# Patient Record
Sex: Male | Born: 1945 | Race: White | Hispanic: Yes | Marital: Married | Smoking: Never smoker
Health system: Southern US, Community
[De-identification: ages and names within clinical notes are randomized; demographics above are authoritative.]

## PROBLEM LIST (undated history)

## (undated) DIAGNOSIS — N200 Calculus of kidney: Secondary | ICD-10-CM

## (undated) DIAGNOSIS — K219 Gastro-esophageal reflux disease without esophagitis: Secondary | ICD-10-CM

## (undated) DIAGNOSIS — M1990 Unspecified osteoarthritis, unspecified site: Secondary | ICD-10-CM

## (undated) DIAGNOSIS — I499 Cardiac arrhythmia, unspecified: Secondary | ICD-10-CM

## (undated) DIAGNOSIS — N419 Inflammatory disease of prostate, unspecified: Secondary | ICD-10-CM

## (undated) DIAGNOSIS — I10 Essential (primary) hypertension: Secondary | ICD-10-CM

## (undated) DIAGNOSIS — G4730 Sleep apnea, unspecified: Secondary | ICD-10-CM

## (undated) HISTORY — PX: OTHER SURGICAL HISTORY: SHX169

## (undated) HISTORY — PX: TONSILLECTOMY: SUR1361

---

## 2014-11-05 ENCOUNTER — Other Ambulatory Visit (HOSPITAL_COMMUNITY): Payer: Self-pay

## 2014-11-07 ENCOUNTER — Encounter (HOSPITAL_COMMUNITY): Payer: Self-pay | Admitting: *Deleted

## 2014-11-08 ENCOUNTER — Encounter (HOSPITAL_COMMUNITY): Payer: Self-pay | Admitting: *Deleted

## 2014-11-08 NOTE — H&P (Signed)
TOTAL HIP ADMISSION H&P  Patient is admitted for bilateral total hip arthroplasties.  Subjective:  Chief Complaint:    Bilateral hip primary OA / pain  HPI: Lawrence Hunt, 69 y.o. male, has a history of pain and functional disability in bilateral hips due to arthritis and patient has failed non-surgical conservative treatments for greater than 12 weeks to include NSAID's and/or analgesics and activity modification.  Onset of symptoms was gradual starting years ago with gradually worsening course since that time.The patient noted no past surgery on the bilateral hips.  Patient currently rates pain in the bilateral hips at 9 out of 10 with activity. Patient has worsening of pain with activity and weight bearing, trendelenberg gait, pain that interfers with activities of daily living and pain with passive range of motion. Patient has evidence of periarticular osteophytes and joint space narrowing by imaging studies. This condition presents safety issues increasing the risk of falls.  There is no current active infection.  Risks, benefits and expectations were discussed with the patient.  Risks including but not limited to the risk of anesthesia, blood clots, nerve damage, blood vessel damage, failure of the prosthesis, infection and up to and including death.  Patient understand the risks, benefits and expectations and wishes to proceed with surgery.   PCP: No primary care provider on file.  D/C Plans:      Home with HHPT  Post-op Meds:       No Rx given  Tranexamic Acid:      To be given - IV   Decadron:      Is to be given  FYI: ASA  Analgesic medication ?  CPAP   Past Medical History  Diagnosis Date  . Hypertension   . Dysrhythmia     atrial fib   . Sleep apnea     cpap- does not know settings   . Kidney stones     hx of   . Prostatitis   . GERD (gastroesophageal reflux disease)   . Arthritis     Past Surgical History  Procedure Laterality Date  . Ablaton x 2 for atrial fib      . Tonsillectomy    . Left finger fracture surgery       No prescriptions prior to admission   Allergies  Allergen Reactions  . Codeine Itching  . Latex Dermatitis  . Other Dermatitis    Nitro gloves   . Septra [Sulfamethoxazole-Trimethoprim]     Burning sensation in the face.    Social History  Substance Use Topics  . Smoking status: Never Smoker   . Smokeless tobacco: Never Used  . Alcohol Use: Not on file     Comment: rare        Review of Systems  Constitutional: Negative.   HENT: Negative.   Eyes: Negative.   Respiratory: Negative.   Cardiovascular: Negative.   Gastrointestinal: Positive for heartburn.  Genitourinary: Negative.   Musculoskeletal: Positive for joint pain.  Skin: Negative.   Neurological: Negative.   Endo/Heme/Allergies: Negative.   Psychiatric/Behavioral: Negative.     Objective:  Physical Exam  Constitutional: He is oriented to person, place, and time. He appears well-developed.  HENT:  Head: Normocephalic.  Eyes: Pupils are equal, round, and reactive to light.  Neck: Neck supple. No JVD present. No tracheal deviation present. No thyromegaly present.  Cardiovascular: Normal heart sounds and intact distal pulses.   Respiratory: Effort normal and breath sounds normal.  GI: Soft. There is no tenderness. There is  no guarding.  Musculoskeletal:       Right hip: He exhibits decreased range of motion, decreased strength, tenderness and bony tenderness. He exhibits no swelling, no deformity and no laceration.       Left hip: He exhibits decreased range of motion, decreased strength, tenderness and bony tenderness. He exhibits no swelling, no deformity and no laceration.  Lymphadenopathy:    He has no cervical adenopathy.  Neurological: He is alert and oriented to person, place, and time.  Skin: Skin is warm and dry.  Psychiatric: He has a normal mood and affect.     Imaging Review Plain radiographs demonstrate severe degenerative joint  disease of bilateral hips. The bone quality appears to be good for age and reported activity level.  Assessment/Plan:  End stage arthritis, bilateral hips  The patient history, physical examination, clinical judgement of the provider and imaging studies are consistent with end stage degenerative joint disease of bilateral hips and total hip arthroplasties are deemed medically necessary. The treatment options including medical management, injection therapy, arthroscopy and arthroplasty were discussed at length. The risks and benefits of total hip arthroplasty were presented and reviewed. The risks due to aseptic loosening, infection, stiffness, dislocation/subluxation,  thromboembolic complications and other imponderables were discussed.  The patient acknowledged the explanation, agreed to proceed with the plan and consent was signed. Patient is being admitted for inpatient treatment for surgery, pain control, PT, OT, prophylactic antibiotics, VTE prophylaxis, progressive ambulation and ADL's and discharge planning.The patient is planning to be discharged home with home health services.     Lawrence Auerbach [name]   PA-C  11/08/2014, 9:13 AM

## 2014-11-12 ENCOUNTER — Encounter (HOSPITAL_COMMUNITY): Payer: Self-pay | Admitting: *Deleted

## 2014-11-12 ENCOUNTER — Inpatient Hospital Stay (HOSPITAL_COMMUNITY): Payer: Managed Care, Other (non HMO) | Admitting: Anesthesiology

## 2014-11-12 ENCOUNTER — Encounter (HOSPITAL_COMMUNITY)
Admission: RE | Disposition: A | Discharge status: Home Health | Payer: Self-pay | Source: Ambulatory Visit | Attending: Orthopedic Surgery

## 2014-11-12 ENCOUNTER — Inpatient Hospital Stay (HOSPITAL_COMMUNITY): Payer: Managed Care, Other (non HMO)

## 2014-11-12 ENCOUNTER — Inpatient Hospital Stay (HOSPITAL_COMMUNITY)
Admission: RE | Admit: 2014-11-12 | Discharge: 2014-11-14 | DRG: 470 | Disposition: A | Discharge status: Home Health | Payer: Managed Care, Other (non HMO) | Source: Ambulatory Visit | Attending: Orthopedic Surgery | Admitting: Orthopedic Surgery

## 2014-11-12 DIAGNOSIS — Z96649 Presence of unspecified artificial hip joint: Secondary | ICD-10-CM

## 2014-11-12 DIAGNOSIS — I10 Essential (primary) hypertension: Secondary | ICD-10-CM | POA: Diagnosis present

## 2014-11-12 DIAGNOSIS — M160 Bilateral primary osteoarthritis of hip: Principal | ICD-10-CM | POA: Diagnosis present

## 2014-11-12 DIAGNOSIS — Z6828 Body mass index (BMI) 28.0-28.9, adult: Secondary | ICD-10-CM

## 2014-11-12 DIAGNOSIS — Z96643 Presence of artificial hip joint, bilateral: Secondary | ICD-10-CM

## 2014-11-12 DIAGNOSIS — E663 Overweight: Secondary | ICD-10-CM | POA: Diagnosis present

## 2014-11-12 HISTORY — DX: Inflammatory disease of prostate, unspecified: N41.9

## 2014-11-12 HISTORY — DX: Gastro-esophageal reflux disease without esophagitis: K21.9

## 2014-11-12 HISTORY — DX: Calculus of kidney: N20.0

## 2014-11-12 HISTORY — DX: Sleep apnea, unspecified: G47.30

## 2014-11-12 HISTORY — PX: BILATERAL ANTERIOR TOTAL HIP ARTHROPLASTY: SHX5567

## 2014-11-12 HISTORY — DX: Cardiac arrhythmia, unspecified: I49.9

## 2014-11-12 HISTORY — DX: Unspecified osteoarthritis, unspecified site: M19.90

## 2014-11-12 HISTORY — DX: Essential (primary) hypertension: I10

## 2014-11-12 LAB — TYPE AND SCREEN
ABO/RH(D): O POS
ANTIBODY SCREEN: NEGATIVE

## 2014-11-12 LAB — ABO/RH: ABO/RH(D): O POS

## 2014-11-12 SURGERY — ARTHROPLASTY, HIP, BILATERAL, TOTAL, ANTERIOR APPROACH
Anesthesia: Spinal | Site: Hip | Laterality: Bilateral

## 2014-11-12 MED ORDER — LACTATED RINGERS IV SOLN
INTRAVENOUS | Status: DC
  Administered 2014-11-12: 1000 mL via INTRAVENOUS
  Administered 2014-11-12 (×2): via INTRAVENOUS

## 2014-11-12 MED ORDER — EPHEDRINE SULFATE 50 MG/ML IJ SOLN
INTRAMUSCULAR | Status: AC
  Filled 2014-11-12: qty 1

## 2014-11-12 MED ORDER — PROPOFOL 10 MG/ML IV BOLUS
INTRAVENOUS | Status: AC
  Filled 2014-11-12: qty 20

## 2014-11-12 MED ORDER — 0.9 % SODIUM CHLORIDE (POUR BTL) OPTIME
TOPICAL | Status: DC | PRN
  Administered 2014-11-12: 1000 mL

## 2014-11-12 MED ORDER — MIDAZOLAM HCL 2 MG/2ML IJ SOLN
INTRAMUSCULAR | Status: AC
  Filled 2014-11-12: qty 4

## 2014-11-12 MED ORDER — CEFAZOLIN SODIUM-DEXTROSE 2-3 GM-% IV SOLR
2.0000 g | INTRAVENOUS | Status: AC
  Administered 2014-11-12: 2 g via INTRAVENOUS

## 2014-11-12 MED ORDER — TRANEXAMIC ACID 1000 MG/10ML IV SOLN
1000.0000 mg | INTRAVENOUS | Status: AC
  Administered 2014-11-12: 1000 mg via INTRAVENOUS
  Filled 2014-11-12: qty 10

## 2014-11-12 MED ORDER — FENTANYL CITRATE (PF) 100 MCG/2ML IJ SOLN
INTRAMUSCULAR | Status: AC
  Filled 2014-11-12: qty 4

## 2014-11-12 MED ORDER — HYDROCODONE-ACETAMINOPHEN 7.5-325 MG PO TABS
1.0000 | ORAL_TABLET | ORAL | Status: DC
  Administered 2014-11-12: 1 via ORAL
  Administered 2014-11-13 (×4): 2 via ORAL
  Filled 2014-11-12 (×5): qty 2

## 2014-11-12 MED ORDER — STERILE WATER FOR IRRIGATION IR SOLN
Status: DC | PRN
  Administered 2014-11-12: 1000 mL

## 2014-11-12 MED ORDER — DEXAMETHASONE SODIUM PHOSPHATE 10 MG/ML IJ SOLN
10.0000 mg | Freq: Once | INTRAMUSCULAR | Status: AC
  Administered 2014-11-13: 10 mg via INTRAVENOUS
  Filled 2014-11-12: qty 1

## 2014-11-12 MED ORDER — MENTHOL 3 MG MT LOZG: 1.0000 | LOZENGE | OROMUCOSAL | Status: DC | PRN

## 2014-11-12 MED ORDER — FAMOTIDINE 20 MG PO TABS
20.0000 mg | ORAL_TABLET | Freq: Every day | ORAL | Status: DC | PRN
  Filled 2014-11-12: qty 1

## 2014-11-12 MED ORDER — FERROUS SULFATE 325 (65 FE) MG PO TABS
325.0000 mg | ORAL_TABLET | Freq: Three times a day (TID) | ORAL | Status: DC
Start: 2014-11-12 — End: 2014-11-14
  Administered 2014-11-12 – 2014-11-14 (×6): 325 mg via ORAL
  Filled 2014-11-12 (×8): qty 1

## 2014-11-12 MED ORDER — HYDROMORPHONE HCL 1 MG/ML IJ SOLN
0.5000 mg | INTRAMUSCULAR | Status: DC | PRN
  Administered 2014-11-12 – 2014-11-13 (×3): 1 mg via INTRAVENOUS
  Filled 2014-11-12: qty 1
  Filled 2014-11-12: qty 2
  Filled 2014-11-12 (×2): qty 1

## 2014-11-12 MED ORDER — BUPIVACAINE HCL (PF) 0.5 % IJ SOLN
INTRAMUSCULAR | Status: DC | PRN
  Administered 2014-11-12: 3 mL

## 2014-11-12 MED ORDER — APIXABAN 5 MG PO TABS
5.0000 mg | ORAL_TABLET | Freq: Two times a day (BID) | ORAL | Status: DC
  Administered 2014-11-13 – 2014-11-14 (×3): 5 mg via ORAL
  Filled 2014-11-12 (×5): qty 1

## 2014-11-12 MED ORDER — PROPOFOL 500 MG/50ML IV EMUL
INTRAVENOUS | Status: DC | PRN
  Administered 2014-11-12: 30 mg via INTRAVENOUS

## 2014-11-12 MED ORDER — ALUM & MAG HYDROXIDE-SIMETH 200-200-20 MG/5ML PO SUSP: 30.0000 mL | ORAL | Status: DC | PRN

## 2014-11-12 MED ORDER — ONDANSETRON HCL 4 MG/2ML IJ SOLN: 4.0000 mg | Freq: Four times a day (QID) | INTRAMUSCULAR | Status: DC | PRN

## 2014-11-12 MED ORDER — DEXAMETHASONE SODIUM PHOSPHATE 10 MG/ML IJ SOLN
10.0000 mg | Freq: Once | INTRAMUSCULAR | Status: AC
  Administered 2014-11-12: 10 mg via INTRAVENOUS

## 2014-11-12 MED ORDER — HYDROMORPHONE HCL 1 MG/ML IJ SOLN
0.2500 mg | INTRAMUSCULAR | Status: DC | PRN
  Administered 2014-11-12 (×2): 0.5 mg via INTRAVENOUS

## 2014-11-12 MED ORDER — SODIUM CHLORIDE 0.9 % IJ SOLN
INTRAMUSCULAR | Status: AC
  Filled 2014-11-12: qty 10

## 2014-11-12 MED ORDER — PROPOFOL 500 MG/50ML IV EMUL
INTRAVENOUS | Status: DC | PRN
  Administered 2014-11-12: 50 ug/kg/min via INTRAVENOUS

## 2014-11-12 MED ORDER — HYDROCHLOROTHIAZIDE 12.5 MG PO CAPS
12.5000 mg | ORAL_CAPSULE | Freq: Every day | ORAL | Status: DC
  Administered 2014-11-12 – 2014-11-14 (×3): 12.5 mg via ORAL
  Filled 2014-11-12 (×3): qty 1

## 2014-11-12 MED ORDER — ACETAMINOPHEN 10 MG/ML IV SOLN
1000.0000 mg | Freq: Four times a day (QID) | INTRAVENOUS | Status: DC
  Administered 2014-11-12: 1000 mg via INTRAVENOUS

## 2014-11-12 MED ORDER — DRONEDARONE HCL 400 MG PO TABS
400.0000 mg | ORAL_TABLET | Freq: Two times a day (BID) | ORAL | Status: DC
  Administered 2014-11-13 – 2014-11-14 (×3): 400 mg via ORAL
  Filled 2014-11-12 (×5): qty 1

## 2014-11-12 MED ORDER — ACETAMINOPHEN 10 MG/ML IV SOLN
INTRAVENOUS | Status: AC
  Filled 2014-11-12: qty 100

## 2014-11-12 MED ORDER — TRANEXAMIC ACID 1000 MG/10ML IV SOLN
1000.0000 mg | Freq: Once | INTRAVENOUS | Status: AC
  Administered 2014-11-12: 1000 mg via INTRAVENOUS
  Filled 2014-11-12: qty 10

## 2014-11-12 MED ORDER — CEFAZOLIN SODIUM-DEXTROSE 2-3 GM-% IV SOLR
INTRAVENOUS | Status: AC
  Filled 2014-11-12: qty 50

## 2014-11-12 MED ORDER — CHLORHEXIDINE GLUCONATE 4 % EX LIQD
60.0000 mL | Freq: Once | CUTANEOUS | Status: DC
Start: 2014-11-12 — End: 2014-11-12

## 2014-11-12 MED ORDER — CEFAZOLIN SODIUM-DEXTROSE 2-3 GM-% IV SOLR
2.0000 g | Freq: Four times a day (QID) | INTRAVENOUS | Status: AC
  Administered 2014-11-12 – 2014-11-13 (×2): 2 g via INTRAVENOUS
  Filled 2014-11-12 (×2): qty 50

## 2014-11-12 MED ORDER — DOCUSATE SODIUM 100 MG PO CAPS
100.0000 mg | ORAL_CAPSULE | Freq: Two times a day (BID) | ORAL | Status: DC
Start: 2014-11-12 — End: 2014-11-14
  Administered 2014-11-12 – 2014-11-14 (×3): 100 mg via ORAL

## 2014-11-12 MED ORDER — METHOCARBAMOL 1000 MG/10ML IJ SOLN
500.0000 mg | Freq: Four times a day (QID) | INTRAVENOUS | Status: DC | PRN
  Administered 2014-11-12: 500 mg via INTRAVENOUS
  Filled 2014-11-12 (×2): qty 5

## 2014-11-12 MED ORDER — ONDANSETRON HCL 4 MG PO TABS: 4.0000 mg | ORAL_TABLET | Freq: Four times a day (QID) | ORAL | Status: DC | PRN

## 2014-11-12 MED ORDER — PHENOL 1.4 % MT LIQD
1.0000 | OROMUCOSAL | Status: DC | PRN
  Filled 2014-11-12: qty 177

## 2014-11-12 MED ORDER — HYDROMORPHONE HCL 1 MG/ML IJ SOLN
INTRAMUSCULAR | Status: AC
  Filled 2014-11-12: qty 1

## 2014-11-12 MED ORDER — BISACODYL 10 MG RE SUPP: 10.0000 mg | Freq: Every day | RECTAL | Status: DC | PRN

## 2014-11-12 MED ORDER — EPHEDRINE SULFATE 50 MG/ML IJ SOLN
INTRAMUSCULAR | Status: DC | PRN
  Administered 2014-11-12: 5 mg via INTRAVENOUS

## 2014-11-12 MED ORDER — SODIUM CHLORIDE 0.9 % IV SOLN
100.0000 mL/h | INTRAVENOUS | Status: DC
  Administered 2014-11-12: 100 mL/h via INTRAVENOUS
  Filled 2014-11-12 (×6): qty 1000

## 2014-11-12 MED ORDER — METOCLOPRAMIDE HCL 5 MG/ML IJ SOLN: 5.0000 mg | Freq: Three times a day (TID) | INTRAMUSCULAR | Status: DC | PRN

## 2014-11-12 MED ORDER — METHOCARBAMOL 500 MG PO TABS
500.0000 mg | ORAL_TABLET | Freq: Four times a day (QID) | ORAL | Status: DC | PRN
  Administered 2014-11-12 – 2014-11-14 (×3): 500 mg via ORAL
  Filled 2014-11-12 (×3): qty 1

## 2014-11-12 MED ORDER — FENTANYL CITRATE (PF) 100 MCG/2ML IJ SOLN
INTRAMUSCULAR | Status: DC | PRN
  Administered 2014-11-12: 100 ug via INTRAVENOUS

## 2014-11-12 MED ORDER — DIPHENHYDRAMINE HCL 25 MG PO CAPS
25.0000 mg | ORAL_CAPSULE | Freq: Four times a day (QID) | ORAL | Status: DC | PRN
  Filled 2014-11-12: qty 1

## 2014-11-12 MED ORDER — POLYETHYLENE GLYCOL 3350 17 G PO PACK
17.0000 g | PACK | Freq: Two times a day (BID) | ORAL | Status: DC
  Administered 2014-11-13 – 2014-11-14 (×2): 17 g via ORAL

## 2014-11-12 MED ORDER — MAGNESIUM CITRATE PO SOLN
1.0000 | Freq: Once | ORAL | Status: DC | PRN
Start: 2014-11-12 — End: 2014-11-14

## 2014-11-12 MED ORDER — MIDAZOLAM HCL 5 MG/5ML IJ SOLN
INTRAMUSCULAR | Status: DC | PRN
  Administered 2014-11-12: 2 mg via INTRAVENOUS

## 2014-11-12 MED ORDER — METOCLOPRAMIDE HCL 10 MG PO TABS: 5.0000 mg | ORAL_TABLET | Freq: Three times a day (TID) | ORAL | Status: DC | PRN

## 2014-11-12 SURGICAL SUPPLY — 35 items
BAG ZIPLOCK 12X15 (MISCELLANEOUS) ×3 IMPLANT
BLADE CLIPPER SURG (BLADE) ×3 IMPLANT
CAPT HIP TOTAL 2 ×6 IMPLANT
COVER PERINEAL POST (MISCELLANEOUS) ×3 IMPLANT
DRAPE C-ARM 42X120 X-RAY (DRAPES) ×3 IMPLANT
DRAPE STERI IOBAN 125X83 (DRAPES) ×6 IMPLANT
DRAPE U-SHAPE 47X51 STRL (DRAPES) ×18 IMPLANT
DRSG AQUACEL AG ADV 3.5X10 (GAUZE/BANDAGES/DRESSINGS) ×6 IMPLANT
DURAPREP 26ML APPLICATOR (WOUND CARE) ×6 IMPLANT
ELECT PENCIL ROCKER SW 15FT (MISCELLANEOUS) ×3 IMPLANT
ELECT REM PT RETURN 9FT ADLT (ELECTROSURGICAL) ×6
ELECTRODE REM PT RTRN 9FT ADLT (ELECTROSURGICAL) ×2 IMPLANT
FACESHIELD WRAPAROUND (MASK) ×12 IMPLANT
GLOVE BIOGEL PI IND STRL 7.5 (GLOVE) ×2 IMPLANT
GLOVE BIOGEL PI IND STRL 8.5 (GLOVE) ×2 IMPLANT
GLOVE BIOGEL PI INDICATOR 7.5 (GLOVE) ×4
GLOVE BIOGEL PI INDICATOR 8.5 (GLOVE) ×4
GLOVE ECLIPSE 8.0 STRL XLNG CF (GLOVE) ×6 IMPLANT
GLOVE ORTHO TXT STRL SZ7.5 (GLOVE) ×12 IMPLANT
GOWN SPEC L3 XXLG W/TWL (GOWN DISPOSABLE) ×6 IMPLANT
GOWN STRL REUS W/TWL LRG LVL3 (GOWN DISPOSABLE) ×3 IMPLANT
LIQUID BAND (GAUZE/BANDAGES/DRESSINGS) ×6 IMPLANT
NDL SAFETY ECLIPSE 18X1.5 (NEEDLE) ×1 IMPLANT
NEEDLE HYPO 18GX1.5 SHARP (NEEDLE) ×2
PACK ANTERIOR HIP CUSTOM (KITS) ×3 IMPLANT
SAW OSC TIP CART 19.5X105X1.3 (SAW) ×3 IMPLANT
SPONGE LAP 18X18 X RAY DECT (DISPOSABLE) ×3 IMPLANT
SUT MNCRL AB 4-0 PS2 18 (SUTURE) ×6 IMPLANT
SUT VIC AB 1 CT1 36 (SUTURE) ×24 IMPLANT
SUT VIC AB 2-0 CT1 27 (SUTURE) ×8
SUT VIC AB 2-0 CT1 TAPERPNT 27 (SUTURE) ×4 IMPLANT
SYR 50ML LL SCALE MARK (SYRINGE) ×3 IMPLANT
TOWEL OR 17X26 10 PK STRL BLUE (TOWEL DISPOSABLE) ×3 IMPLANT
TRAY FOLEY W/METER SILVER 16FR (SET/KITS/TRAYS/PACK) ×3 IMPLANT
YANKAUER SUCT BULB TIP 10FT TU (MISCELLANEOUS) ×6 IMPLANT

## 2014-11-12 NOTE — Anesthesia Preprocedure Evaluation (Addendum)
Anesthesia Evaluation  Patient identified by MRN, date of birth, ID band Patient awake    Reviewed: Allergy & Precautions, H&P , NPO status , Patient's Chart, lab work & pertinent test results  Airway Mallampati: II  TM Distance: >3 FB Neck ROM: Full    Dental no notable dental hx. (+) Teeth Intact, Dental Advisory Given   Pulmonary sleep apnea and Continuous Positive Airway Pressure Ventilation ,    Pulmonary exam normal breath sounds clear to auscultation       Cardiovascular hypertension, Pt. on medications + dysrhythmias Atrial Fibrillation  Rhythm:Regular Rate:Normal  S/p ablation for afib   Neuro/Psych negative neurological ROS  negative psych ROS   GI/Hepatic Neg liver ROS, GERD  Medicated and Controlled,  Endo/Other  negative endocrine ROS  Renal/GU negative Renal ROS  negative genitourinary   Musculoskeletal  (+) Arthritis , Osteoarthritis,    Abdominal   Peds  Hematology negative hematology ROS (+)   Anesthesia Other Findings   Reproductive/Obstetrics negative OB ROS                            Anesthesia Physical Anesthesia Plan  ASA: III  Anesthesia Plan: Spinal   Post-op Pain Management:    Induction: Intravenous  Airway Management Planned: Simple Face Mask  Additional Equipment:   Intra-op Plan:   Post-operative Plan: Extubation in OR  Informed Consent: I have reviewed the patients History and Physical, chart, labs and discussed the procedure including the risks, benefits and alternatives for the proposed anesthesia with the patient or authorized representative who has indicated his/her understanding and acceptance.   Dental advisory given  Plan Discussed with: CRNA  Anesthesia Plan Comments:         Anesthesia Quick Evaluation

## 2014-11-12 NOTE — Anesthesia Procedure Notes (Signed)
Spinal  Start time: 11/12/2014 2:57 PM End time: 11/12/2014 3:02 PM Staffing Resident/CRNA: Mirian MoARVER, Penina Reisner J Performed by: resident/CRNA  Preanesthetic Checklist Completed: patient identified, site marked, surgical consent, pre-op evaluation, timeout performed, IV checked, risks and benefits discussed and monitors and equipment checked Spinal Block Patient position: sitting Prep: Betadine Patient monitoring: heart rate, continuous pulse ox and blood pressure Approach: midline Location: L3-4 Injection technique: single-shot Needle Needle type: Sprotte  Needle gauge: 24 G Needle length: 9 cm Needle insertion depth: 5 cm Additional Notes Pt sitting postion for spinal, administered without parasthesia.

## 2014-11-12 NOTE — Interval H&P Note (Signed)
History and Physical Interval Note:  11/12/2014 1:41 PM  Lawrence Hunt  has presented today for surgery, with the diagnosis of bilateral hip osteoarthritis  The various methods of treatment have been discussed with the patient and family. After consideration of risks, benefits and other options for treatment, the patient has consented to  Procedure(s): BILATERAL ANTERIOR TOTAL HIP ARTHROPLASTY (Bilateral) as a surgical intervention .  The patient's history has been reviewed, patient examined, no change in status, stable for surgery.  I have reviewed the patient's chart and labs.  Questions were answered to the patient's satisfaction.     [name],[name]

## 2014-11-12 NOTE — Transfer of Care (Signed)
Immediate Anesthesia Transfer of Care Note  Patient: Lawrence Hunt  Procedure(s) Performed: Procedure(s): BILATERAL ANTERIOR TOTAL HIP ARTHROPLASTY (Bilateral)  Patient Location: PACU  Anesthesia Type:MAC and Spinal  Level of Consciousness: sedated, patient cooperative and responds to stimulation  Airway & Oxygen Therapy: Patient Spontanous Breathing and Patient connected to face mask oxygen  Post-op Assessment: Report given to RN and Post -op Vital signs reviewed and stable  Post vital signs: Reviewed and stable  Last Vitals:  Filed Vitals:   11/12/14 1110  BP: 137/66  Pulse: 65  Temp: 36.4 C  Resp: 18    Complications: No apparent anesthesia complications

## 2014-11-12 NOTE — Discharge Instructions (Addendum)
INSTRUCTIONS AFTER JOINT REPLACEMENT  ° °o Remove items at home which could result in a fall. This includes throw rugs or furniture in walking pathways °o ICE to the affected joint every three hours while awake for 30 minutes at a time, for at least the first 3-5 days, and then as needed for pain and swelling.  Continue to use ice for pain and swelling. You may notice swelling that will progress down to the foot and ankle.  This is normal after surgery.  Elevate your leg when you are not up walking on it.   °o Continue to use the breathing machine you got in the hospital (incentive spirometer) which will help keep your temperature down.  It is common for your temperature to cycle up and down following surgery, especially at night when you are not up moving around and exerting yourself.  The breathing machine keeps your lungs expanded and your temperature down. ° ° °DIET:  As you were doing prior to hospitalization, we recommend a well-balanced diet. ° °DRESSING / WOUND CARE / SHOWERING ° °Keep the surgical dressing until follow up.  The dressing is water proof, so you can shower without any extra covering.  IF THE DRESSING FALLS OFF or the wound gets wet inside, change the dressing with sterile gauze.  Please use good hand washing techniques before changing the dressing.  Do not use any lotions or creams on the incision until instructed by your surgeon.   ° °ACTIVITY ° °o Increase activity slowly as tolerated, but follow the weight bearing instructions below.   °o No driving for 6 weeks or until further direction given by your physician.  You cannot drive while taking narcotics.  °o No lifting or carrying greater than 10 lbs. until further directed by your surgeon. °o Avoid periods of inactivity such as sitting longer than an hour when not asleep. This helps prevent blood clots.  °o You may return to work once you are authorized by your doctor.  ° ° ° °WEIGHT BEARING  ° °Weight bearing as tolerated with assist  device (walker, cane, etc) as directed, use it as long as suggested by your surgeon or therapist, typically at least 4-6 weeks. ° ° °EXERCISES ° °Results after joint replacement surgery are often greatly improved when you follow the exercise, range of motion and muscle strengthening exercises prescribed by your doctor. Safety measures are also important to protect the joint from further injury. Any time any of these exercises cause you to have increased pain or swelling, decrease what you are doing until you are comfortable again and then slowly increase them. If you have problems or questions, call your caregiver or physical therapist for advice.  ° °Rehabilitation is important following a joint replacement. After just a few days of immobilization, the muscles of the leg can become weakened and shrink (atrophy).  These exercises are designed to build up the tone and strength of the thigh and leg muscles and to improve motion. Often times heat used for twenty to thirty minutes before working out will loosen up your tissues and help with improving the range of motion but do not use heat for the first two weeks following surgery (sometimes heat can increase post-operative swelling).  ° °These exercises can be done on a training (exercise) mat, on the floor, on a table or on a bed. Use whatever works the best and is most comfortable for you.    Use music or television while you are exercising so that   the exercises are a pleasant break in your day. This will make your life better with the exercises acting as a break in your routine that you can look forward to.   Perform all exercises about fifteen times, three times per day or as directed.  You should exercise both the operative leg and the other leg as well. ° °Exercises include: °  °• Quad Sets - Tighten up the muscle on the front of the thigh (Quad) and hold for 5-10 seconds.   °• Straight Leg Raises - With your knee straight (if you were given a brace, keep it on),  lift the leg to 60 degrees, hold for 3 seconds, and slowly lower the leg.  Perform this exercise against resistance later as your leg gets stronger.  °• Leg Slides: Lying on your back, slowly slide your foot toward your buttocks, bending your knee up off the floor (only go as far as is comfortable). Then slowly slide your foot back down until your leg is flat on the floor again.  °• Angel Wings: Lying on your back spread your legs to the side as far apart as you can without causing discomfort.  °• Hamstring Strength:  Lying on your back, push your heel against the floor with your leg straight by tightening up the muscles of your buttocks.  Repeat, but this time bend your knee to a comfortable angle, and push your heel against the floor.  You may put a pillow under the heel to make it more comfortable if necessary.  ° °A rehabilitation program following joint replacement surgery can speed recovery and prevent re-injury in the future due to weakened muscles. Contact your doctor or a physical therapist for more information on knee rehabilitation.  ° ° °CONSTIPATION ° °Constipation is defined medically as fewer than three stools per week and severe constipation as less than one stool per week.  Even if you have a regular bowel pattern at home, your normal regimen is likely to be disrupted due to multiple reasons following surgery.  Combination of anesthesia, postoperative narcotics, change in appetite and fluid intake all can affect your bowels.  ° °YOU MUST use at least one of the following options; they are listed in order of increasing strength to get the job done.  They are all available over the counter, and you may need to use some, POSSIBLY even all of these options:   ° °Drink plenty of fluids (prune juice may be helpful) and high fiber foods °Colace 100 mg by mouth twice a day  °Senokot for constipation as directed and as needed Dulcolax (bisacodyl), take with full glass of water  °Miralax (polyethylene glycol)  once or twice a day as needed. ° °If you have tried all these things and are unable to have a bowel movement in the first 3-4 days after surgery call either your surgeon or your primary doctor.   ° °If you experience loose stools or diarrhea, hold the medications until you stool forms back up.  If your symptoms do not get better within 1 week or if they get worse, check with your doctor.  If you experience "the worst abdominal pain ever" or develop nausea or vomiting, please contact the office immediately for further recommendations for treatment. ° ° °ITCHING:  If you experience itching with your medications, try taking only a single pain pill, or even half a pain pill at a time.  You can also use Benadryl over the counter for itching or also to   help with sleep.   TED HOSE STOCKINGS:  Use stockings on both legs until for at least 2 weeks or as directed by physician office. They may be removed at night for sleeping.  MEDICATIONS:  See your medication summary on the After Visit Summary that nursing will review with you.  You may have some home medications which will be placed on hold until you complete the course of blood thinner medication.  It is important for you to complete the blood thinner medication as prescribed.  PRECAUTIONS:  If you experience chest pain or shortness of breath - call 911 immediately for transfer to the hospital emergency department.   If you develop a fever greater that 101 F, purulent drainage from wound, increased redness or drainage from wound, foul odor from the wound/dressing, or calf pain - CONTACT YOUR SURGEON.                                                   FOLLOW-UP APPOINTMENTS:  If you do not already have a post-op appointment, please call the office for an appointment to be seen by your surgeon.  Guidelines for how soon to be seen are listed in your After Visit Summary, but are typically between 1-4 weeks after surgery.  OTHER INSTRUCTIONS:   Knee  Replacement:  Do not place pillow under knee, focus on keeping the knee straight while resting.   MAKE SURE YOU:   Understand these instructions.   Get help right away if you are not doing well or get worse.    Thank you for letting us be a part of your medical care team.  It is a privilege we respect greatly.  We hope these instructions will help you stay on track for a fast and full recovery!     Information on my medicine - ELIQUIS (apixaban) This medication education was reviewed with me or my healthcare representative as part of my discharge preparation.  The pharmacist that spoke with me during my hospital stay was:  Wynona Canes  Why was Eliquis prescribed for you? Eliquis was prescribed for you to reduce the risk of blood clots forming after orthopedic surgery.    What do You need to know about Eliquis? Take your Eliquis TWICE DAILY - one tablet in the morning and one tablet in the evening with or without food.  It would be best to take the dose about the same time each day.  If you have difficulty swallowing the tablet whole please discuss with your pharmacist how to take the medication safely.  Take Eliquis exactly as prescribed by your doctor and DO NOT stop taking Eliquis without talking to the doctor who prescribed the medication.  Stopping without other medication to take the place of Eliquis may increase your risk of developing a clot.  After discharge, you should have regular check-up appointments with your healthcare provider that is prescribing your Eliquis.  What do you do if you miss a dose? If a dose of ELIQUIS is not taken at the scheduled time, take it as soon as possible on the same day and twice-daily administration should be resumed.  The dose should not be doubled to make up for a missed dose.  Do not take more than one tablet of ELIQUIS at the same time.  Important Safety Information A possible side effect of Eliquis  is bleeding. You should call your  healthcare provider right away if you experience any of the following: ? Bleeding from an injury or your nose that does not stop. ? Unusual colored urine (red or dark brown) or unusual colored stools (red or black). ? Unusual bruising for unknown reasons. ? A serious fall or if you hit your head (even if there is no bleeding).  Some medicines may interact with Eliquis and might increase your risk of bleeding or clotting while on Eliquis. To help avoid this, consult your healthcare provider or pharmacist prior to using any new prescription or non-prescription medications, including herbals, vitamins, non-steroidal anti-inflammatory drugs (NSAIDs) and supplements.  This website has more information on Eliquis (apixaban): http://www.eliquis.com/eliquis/home

## 2014-11-12 NOTE — Anesthesia Postprocedure Evaluation (Signed)
  Anesthesia Post-op Note  Patient: Lawrence Hunt  Procedure(s) Performed: Procedure(s): BILATERAL ANTERIOR TOTAL HIP ARTHROPLASTY (Bilateral)  Patient Location: PACU  Anesthesia Type: Spinal/MAC  Level of Consciousness: awake and alert   Airway and Oxygen Therapy: Patient Spontanous Breathing  Post-op Pain: Controlled  Post-op Assessment: Post-op Vital signs reviewed, Patient's Cardiovascular Status Stable and Respiratory Function Stable. Block receeding.  Post-op Vital Signs: Reviewed  Filed Vitals:   11/12/14 1815  BP: 121/63  Pulse: 58  Temp:   Resp: 17    Complications: No apparent anesthesia complications

## 2014-11-12 NOTE — Op Note (Signed)
NAME:  Lawrence Hunt                ACCOUNT NO.: [id number]      MEDICAL RECORD NO.: [id number]      FACILITY:  [facility]      PHYSICIAN:  Durene Romans D  DATE OF BIRTH:  09-27-1945     DATE OF PROCEDURE:  11/12/2014                                 OPERATIVE REPORT         PREOPERATIVE DIAGNOSIS: Bilateral  hip osteoarthritis.      POSTOPERATIVE DIAGNOSIS:  Bilateral hip osteoarthritis.      PROCEDURE:  Right total hip replacement through an anterior approach   utilizing DePuy THR system, component size 52mm pinnacle cup, a size 36+4 neutral   Altrex liner, a size 3 Hi Tri Lock stem with a 36+1.5 delta ceramic   ball.      SURGEON:  Madlyn Frankel. Charlann Boxer, M.D.      ASSISTANT:  Lanney Gins, PA-C      ANESTHESIA:  Spinal.      SPECIMENS:  None.      COMPLICATIONS:  None.      BLOOD LOSS:  600 cc     DRAINS:  None.      INDICATION OF THE PROCEDURE:  Lawrence Hunt is a 69 y.o. male who had   presented to office for evaluation of bilateral hip pain.  Radiographs revealed   significant advanced bilateral degenerative changes with bone-on-bone   articulation to the  hip joint.  Complete loss of joint space, periarticular osteophytes and femoral and acetabular cysts.  The patient had painful limited range of   motion to both hips significantly affecting their overall quality of life.  The patient was failing to    respond to conservative measures, and at this point was ready   to proceed with more definitive measures.  The patient has noted progressive   degenerative changes in his hip, progressive problems and dysfunction   with regarding the hip prior to surgery.  Consent was obtained for   benefit of pain relief.  Specific risk of infection, DVT, component   failure, dislocation, need for revision surgery, as well discussion of   the anterior versus posterior approach were reviewed.  Consent was   obtained for benefit of bilateral anterior pain relief  through an anterior   approach.   The first templated op note is for hi right hip followed consecutively by the the left.  Of note he had more adherent peri-capsular soft tissue on the right hip versus the left.     PROCEDURE IN DETAIL:  The patient was brought to operative theater.   Once adequate anesthesia, preoperative antibiotics, 2gm of Ancef, 1 gm of Tranexamic Acid per hip, and 10 mg of Decadron administered.   The patient was positioned supine on the OSI Hanna table.  Once adequate   padding of boney process was carried out, we had predraped out the hip, and  used fluoroscopy to confirm orientation of the pelvis and position.     The both hips were then prepped and draped from proximal iliac crest to   mid thigh with a modified shower curtain technique.      Time-out was performed identifying the patient, planned procedures, and each  extremity prior to each incision.  Again attention was  first directed to the right hip.     An incision was then made 2 cm distal and lateral to the   anterior superior iliac spine extending over the orientation of the   tensor fascia lata muscle and sharp dissection was carried down to the   fascia of the muscle and protractor placed in the soft tissues.      The fascia was then incised.  The muscle belly was identified and swept   laterally and retractor placed along the superior neck.  Following   cauterization of the circumflex vessels and removing some pericapsular   fat, a second cobra retractor was placed on the inferior neck.  A third   retractor was placed on the anterior acetabulum after elevating the   anterior rectus.  A capsulectomy was performed due to the significant adherence of the capsule to the head and neck but also the over lying soft tissues around the joint.  We then identified the trochanteric fossa and   orientation of my neck cut, confirmed this radiographically   and then made a neck osteotomy with the femur on  traction.  The femoral   head was removed without difficulty or complication.  Traction was let   off and retractors were placed posterior and anterior around the   acetabulum.      The labrum and foveal tissue were debrided.  I began reaming with a 47mm   reamer and reamed up to 51mm reamer with good bony bed preparation and a 52mm   cup was chosen.  The final 52mm Pinnacle cup was then impacted under fluoroscopy  to confirm the depth of penetration and orientation with respect to   abduction.  A screw was placed followed by the hole eliminator.  The final   36+4 neutral Altrex liner was impacted with good visualized rim fit.  The cup was positioned anatomically within the acetabular portion of the pelvis.      At this point, the femur was rolled at 80 degrees.  Further capsule was   released off the inferior aspect of the femoral neck.  I then   released the superior capsule proximally.  The hook was placed laterally   along the femur and elevated manually and held in position with the bed   hook.  The leg was then extended and adducted with the leg rolled to 100   degrees of external rotation.  Once the proximal femur was fully   exposed, I used a box osteotome to set orientation.  I then began   broaching with the starting chili pepper broach and passed this by hand and then broached up to 3.  With the 3 broach in place I chose a high offset neck and did a trial reduction.  The offset was appropriate, leg lengths   appeared to be equal, confirmed radiographically.   Given these findings, I went ahead and dislocated the hip, repositioned all   retractors and positioned the right hip in the extended and abducted position.  The final 3 Hi Tri Lock stem was   chosen and it was impacted down to the level of neck cut.  Based on this   and the trial reduction, a 36+1.5 delta ceramic ball was chosen and   impacted onto a clean and dry trunnion, and the hip was reduced.  The   hip had been  irrigated throughout the case again at this point.  I did   reapproximate the superior capsular leaflet to  the anterior leaflet   using #1 Vicryl.  The fascia of the   tensor fascia lata muscle was then reapproximated using #1 Vicryl and #0 V-lock sutures.  The   remaining wound was closed with 2-0 Vicryl and running 4-0 Monocryl.   The hip was cleaned, dried, and dressed sterilely using Dermabond and   Aquacel dressing.   Lanney Gins, PA-C was present for the entirety of the case involved from   preoperative positioning, perioperative retractor management, general   facilitation of the case, as well as primary wound closure as assistant.   As Mr. Lawrence Hunt was closing the right hip we maintained and switched the sterile fields, repositioning the Fluoro machine to the other side.....  Then on to the left hip as below           Madlyn Frankel. Charlann Boxer, M.D.        11/12/2014 5:31 PM         NAME:  Lawrence Hunt                ACCOUNT NO.: [id number]      MEDICAL RECORD NO.: [id number]      FACILITY:  [facility]      PHYSICIAN:  Durene Romans D  DATE OF BIRTH:  June 17, 1945     DATE OF PROCEDURE:  11/12/2014                                 OPERATIVE REPORT         PREOPERATIVE DIAGNOSIS: Bilateral  hip osteoarthritis.      POSTOPERATIVE DIAGNOSIS:  Bilateral hip osteoarthritis.      PROCEDURE:  Left total hip replacement through an anterior approach   utilizing DePuy THR system, component size 52mm pinnacle cup, a size 36+4 neutral   Altrex liner, a size 3Hi Tri Lock stem with a 36+1.5 delta ceramic   ball.      SURGEON:  Madlyn Frankel. Charlann Boxer, M.D.      ASSISTANT:  Lanney Gins, PA-C     ANESTHESIA:  Spinal.      SPECIMENS:  None.      COMPLICATIONS:  None.      BLOOD LOSS:  200 cc     DRAINS:  None.      INDICATION OF THE PROCEDURE:  Lawrence Hunt is a 69 y.o. male who had   presented to office for evaluation of bilateral hip pain.  Radiographs  revealed   progressive degenerative changes with bone-on-bone   articulation to the  hip joint.  The patient had painful limited range of   motion significantly affecting their overall quality of life.  The patient was failing to    respond to conservative measures, and at this point was ready   to proceed with more definitive measures.  The patient has noted progressive   degenerative changes in his hip, progressive problems and dysfunction   with regarding the hip prior to surgery.  Consent was obtained for   benefit of pain relief.  Specific risk of infection, DVT, component   failure, dislocation, need for revision surgery, as well discussion of   the anterior versus posterior approach were reviewed.  Consent was   obtained for benefit of anterior pain relief through an anterior   approach.      PROCEDURE IN DETAIL:  The patient was brought to operative theater.   Once adequate anesthesia, preoperative antibiotics previously administered  but an addition 1 gm of Tranexamic Acid administered.        The both hips were then prepped and draped from proximal iliac crest to   mid thigh with shower curtain technique.      Time-out was performed identifying the patient, planned procedure, and now this left   extremity.     An incision was then made 2 cm distal and lateral to the   anterior superior iliac spine extending over the orientation of the   tensor fascia lata muscle and sharp dissection was carried down to the   fascia of the muscle and protractor placed in the soft tissues.      The fascia was then incised.  The muscle belly was identified and swept   laterally and retractor placed along the superior neck.  Following   cauterization of the circumflex vessels and removing some pericapsular   fat, a second cobra retractor was placed on the inferior neck.  A third   retractor was placed on the anterior acetabulum after elevating the   anterior rectus.  A capsulectomy was  performed again on this side due tot he significant adherence of it to the femoral head and neck as well as due to all the soft tissue adherence related I assume to the advanced degenerative nature of his hips.  We then identified the trochanteric fossa and   orientation of my neck cut, confirmed this radiographically   and then made a neck osteotomy with the femur on traction.  The femoral   head was removed without difficulty or complication.  Traction was let   off and retractors were placed posterior and anterior around the   acetabulum.      The labrum and foveal tissue were debrided.  I began reaming with a 45mm   reamer and reamed up to 51mm reamer with good bony bed preparation and a 52mm   cup was chosen.  The final 52mm Pinnacle cup was then impacted under fluoroscopy  to confirm the depth of penetration and orientation with respect to   abduction.  A screw was placed followed by the hole eliminator.  The final   36+4 neutral Altrex liner was impacted with good visualized rim fit.  The cup was positioned anatomically within the acetabular portion of the pelvis.      At this point, the femur was rolled at 80 degrees.  Further capsule was   released off the inferior aspect of the femoral neck.  I then   released the superior capsule proximally.  The hook was placed laterally   along the femur and elevated manually and held in position with the bed   hook.  The leg was then extended and adducted with the leg rolled to 100   degrees of external rotation.  Once the proximal femur was fully   exposed, I used a box osteotome to set orientation.  I then began   broaching with the starting chili pepper broach and passed this by hand and then broached up to 3 to match the other hip.  With the 3 broach in place I chose a high offset neck and did a trial reduction.  The offset was appropriate, leg lengths   appeared to be equal, confirmed radiographically, now matching the right hip in both offset  and length with the +1.5 head ball.   Given these findings, I went ahead and dislocated the hip, repositioned all   retractors and positioned the right hip in  the extended and abducted position.  The final 3 Hi Tri Lock stem was   chosen and it was impacted down to the level of neck cut.  Based on this   and the trial reduction, a 36+1.5 delta ceramic ball was chosen and   impacted onto a clean and dry trunnion, and the hip was reduced.  The   hip had been irrigated throughout the case again at this point.  The fascia of the   tensor fascia lata muscle was then reapproximated using #1 Vicryl and #0 V-lock sutures.  The   remaining wound was closed with 2-0 Vicryl and running 4-0 Monocryl.   The hip was cleaned, dried, and dressed sterilely using Dermabond and   Aquacel dressing.  He was then brought   to recovery room in stable condition tolerating the procedure well.    Lanney Gins, PA-C was present for the entirety of the case involved from   preoperative positioning, perioperative retractor management, general   facilitation of the case, as well as primary wound closure as assistant.            Madlyn Frankel Charlann Boxer, M.D.        11/12/2014 5:31 PM

## 2014-11-13 ENCOUNTER — Encounter (HOSPITAL_COMMUNITY): Payer: Self-pay | Admitting: Orthopedic Surgery

## 2014-11-13 LAB — BASIC METABOLIC PANEL
Anion gap: 6 (ref 5–15)
BUN: 13 mg/dL (ref 6–20)
CHLORIDE: 103 mmol/L (ref 101–111)
CO2: 29 mmol/L (ref 22–32)
CREATININE: 0.82 mg/dL (ref 0.61–1.24)
Calcium: 8.4 mg/dL — ABNORMAL LOW (ref 8.9–10.3)
GFR calc Af Amer: >60 mL/min (ref >60)
GFR calc non Af Amer: >60 mL/min (ref >60)
Glucose, Bld: 175 mg/dL — ABNORMAL HIGH (ref 65–99)
Potassium: 4.5 mmol/L (ref 3.5–5.1)
Sodium: 138 mmol/L (ref 135–145)

## 2014-11-13 LAB — CBC
HEMATOCRIT: 34.1 % — AB (ref 39.0–52.0)
HEMOGLOBIN: 11.6 g/dL — AB (ref 13.0–17.0)
MCH: 30.5 pg (ref 26.0–34.0)
MCHC: 34 g/dL (ref 30.0–36.0)
MCV: 89.7 fL (ref 78.0–100.0)
Platelets: 239 10*3/uL (ref 150–400)
RBC: 3.8 MIL/uL — ABNORMAL LOW (ref 4.22–5.81)
RDW: 14 % (ref 11.5–15.5)
WBC: 10.6 10*3/uL — ABNORMAL HIGH (ref 4.0–10.5)

## 2014-11-13 MED ORDER — TRAMADOL HCL 50 MG PO TABS
50.0000 mg | ORAL_TABLET | Freq: Four times a day (QID) | ORAL | Status: DC | PRN
  Administered 2014-11-13: 50 mg via ORAL
  Administered 2014-11-14 (×2): 100 mg via ORAL
  Filled 2014-11-13 (×2): qty 2
  Filled 2014-11-13: qty 1

## 2014-11-13 MED ORDER — ACETAMINOPHEN 500 MG PO TABS
1000.0000 mg | ORAL_TABLET | Freq: Three times a day (TID) | ORAL | Status: DC
  Administered 2014-11-13 – 2014-11-14 (×2): 1000 mg via ORAL
  Filled 2014-11-13 (×6): qty 2

## 2014-11-13 NOTE — Progress Notes (Signed)
Physical Therapy Treatment Note    11/13/14 1400  PT Visit Information  Last PT Received On 11/13/14  Assistance Needed +1  History of Present Illness Pt is a 69 year old male s/p bilateral direct anterior THAs  PT Time Calculation  PT Start Time (ACUTE ONLY) 1315  PT Stop Time (ACUTE ONLY) 1330  PT Time Calculation (min) (ACUTE ONLY) 15 min  Subjective Data  Subjective Pt assisted with ambulating in hallway again then assisted back to bed.  Precautions  Precautions Fall  Restrictions  Other Position/Activity Restrictions bilaterally WBAT  Pain Assessment  Pain Assessment 0-10  Pain Score 7  Pain Location bil hips with mobility  Pain Descriptors / Indicators Aching;Sore  Pain Intervention(s) Limited activity within patient's tolerance;Monitored during session;Repositioned  Cognition  Arousal/Alertness Awake/alert  Behavior During Therapy WFL for tasks assessed/performed  Overall Cognitive Status Within Functional Limits for tasks assessed  Bed Mobility  Overal bed mobility Needs Assistance  Bed Mobility Sit to Supine  Sit to supine Mod assist  General bed mobility comments spouse educated on how to safely assist pt's LEs onto bed  Transfers  Overall transfer level Needs assistance  Equipment used Rolling walker (2 wheeled)  Transfers Sit to/from Stand  Sit to Stand Min assist  General transfer comment assist to rise and steady, verbal cues for hand placement  Ambulation/Gait  Ambulation/Gait assistance Min guard  Ambulation Distance (Feet) 150 Feet  Assistive device Rolling walker (2 wheeled)  Gait Pattern/deviations Step-through pattern;Decreased stride length  General Gait Details verbal cues for safety, technique, step length, recliner following; pt progressed to step through pattern with slight L LE circumduction  PT - End of Session  Equipment Utilized During Treatment Gait belt  Activity Tolerance Patient tolerated treatment well  Patient left in bed;with call  bell/phone within reach;with bed alarm set;with family/visitor present  PT - Assessment/Plan  PT Plan Current plan remains appropriate  PT Frequency (ACUTE ONLY) 7X/week  Follow Up Recommendations Home health PT;Supervision for mobility/OOB  PT equipment Rolling walker with 5" wheels  PT Goal Progression  Progress towards PT goals Progressing toward goals  PT General Charges  $$ ACUTE PT VISIT 1 Procedure  PT Treatments  $Gait Training 8-22 mins   Zenovia JarredKati Jahzaria Vary, PT, DPT 11/13/2014 Pager: [phone]

## 2014-11-13 NOTE — Care Management Note (Addendum)
Case Management Note  Patient Details  Name: Lawrence Hunt MRN: [id number] Date of Birth: 03/04/1945  Subjective/Objective:                   TERAL ANTERIOR TOTAL HIP ARTHROPLASTY (Bilateral) Action/Plan: Discharge planning  Expected Discharge Date:  11/14/14               Expected Discharge Plan:  [place]  In-House Referral:     Discharge planning Services  CM Consult  Post Acute Care Choice:  NA Choice offered to:  NA  DME Arranged:  N/A DME Agency:  NA  HH Arranged:  PT HH Agency:  Other - See comment  Status of Service:  Completed, signed off  Medicare Important Message Given:    Date Medicare IM Given:    Medicare IM give by:    Date Additional Medicare IM Given:    Additional Medicare Important Message give by:     If discussed at [place] of Stay Meetings, dates discussed:    Additional Comments: Utilization Review complete. CM met with pt in room and explained choice of home health agency is limited due to his [employer] and where he lives.  CM called [facility] (all [employer] goes through this authorization process) and spoke with Lorenso Courier [phone] who gave me INTAKE # [phone] and requested I fax facesheet, order, face to face, H&P, OP note, last progress note an PT eval note to [phone].  [facility] will call pt and arrange HHPT.  Pt does not need any DME as he has rolling walker and 3n1 at home.  No other CM needs were communicated. Dellie Catholic, RN 11/13/2014, 1:52 PM

## 2014-11-13 NOTE — Progress Notes (Signed)
Patient ID: Lawrence Hunt Lawrence Hunt, male   DOB: 05/18/1945, 69 y.o.   MRN: [id number] Subjective: 1 Day Post-Op Procedure(s) (LRB): BILATERAL ANTERIOR TOTAL HIP ARTHROPLASTY (Bilateral)    Patient reports pain as moderate.  No events over night.  Ready for therapy.  Objective:   VITALS:   Filed Vitals:   11/13/14 0524  BP: 106/57  Pulse: 60  Temp: 98.1 F (36.7 C)  Resp: 16    Neurovascular intact Incision: dressing C/D/I, bilateral  LABS  Recent Labs  11/13/14 0507  HGB 11.6*  HCT 34.1*  WBC 10.6*  PLT 239     Recent Labs  11/13/14 0507  NA 138  K 4.5  BUN 13  CREATININE 0.82  GLUCOSE 175*    No results for input(s): LABPT, INR in the last 72 hours.   Assessment/Plan: 1 Day Post-Op Procedure(s) (LRB): BILATERAL ANTERIOR TOTAL HIP ARTHROPLASTY (Bilateral)   Advance diet Up with therapy Discharge home with home health tomorrow versus Thursday depending on therapy, reviewed simple exercises

## 2014-11-13 NOTE — Evaluation (Signed)
Physical Therapy Evaluation Patient Details Name: Lawrence Hunt MRN: [id number] DOB: 08/08/1945 Today's Date: 11/13/2014   History of Present Illness  Pt is a 69 year old male s/p bilateral direct anterior THAs  Clinical Impression  Pt is s/p bilateral THAs resulting in the deficits listed below (see PT Problem List).  Pt will benefit from skilled PT to increase their independence and safety with mobility to allow discharge to the venue listed below.  Pt mobilizing well POD #1 and anticipates d/c home with spouse.     Follow Up Recommendations Home health PT;Supervision for mobility/OOB    Equipment Recommendations  Rolling walker with 5" wheels    Recommendations for Other Services       Precautions / Restrictions Precautions Precautions: Fall Restrictions Other Position/Activity Restrictions: bilaterally WBAT      Mobility  Bed Mobility Overal bed mobility: Needs Assistance Bed Mobility: Supine to Sit     Supine to sit: Min assist;HOB elevated     General bed mobility comments: verbal cues for technique, increased time and effort, utilized rails and trapeze  Transfers Overall transfer level: Needs assistance Equipment used: Rolling walker (2 wheeled) Transfers: Sit to/from Stand Sit to Stand: Max assist;From elevated surface         General transfer comment: verbal cues for technique, assist to rise and steady, improved controlled descent upon sitting  Ambulation/Gait Ambulation/Gait assistance: Min assist Ambulation Distance (Feet): 35 Feet Assistive device: Rolling walker (2 wheeled) Gait Pattern/deviations: Step-to pattern;Wide base of support     General Gait Details: verbal cues for safety, technique, step length, recliner following  Stairs            Wheelchair Mobility    Modified Rankin (Stroke Patients Only)       Balance                                             Pertinent Vitals/Pain Pain Assessment:  0-10 Pain Score: 7  Pain Location: bil hips Pain Descriptors / Indicators: Sore;Aching Pain Intervention(s): Limited activity within patient's tolerance;Monitored during session;Premedicated before session;Repositioned;Ice applied    Home Living Family/patient expects to be discharged to:: Private residence Living Arrangements: Spouse/significant other Available Help at Discharge: Family Type of Home: House Home Access: Stairs to enter   Secretary/administratorntrance Stairs-Number of Steps: 2 Home Layout: Able to live on main level with bedroom/bathroom Home Equipment: None      Prior Function Level of Independence: Independent               Hand Dominance        Extremity/Trunk Assessment               Lower Extremity Assessment: RLE deficits/detail;LLE deficits/detail RLE Deficits / Details: good quad set, functional hip weakness observed, overall R LE stronger then L LE LLE Deficits / Details: good quad set, functional hip weakness observed     Communication   Communication: No difficulties  Cognition Arousal/Alertness: Awake/alert Behavior During Therapy: WFL for tasks assessed/performed Overall Cognitive Status: Within Functional Limits for tasks assessed                      General Comments      Exercises Total Joint Exercises Ankle Circles/Pumps: AROM;10 reps;Both Quad Sets: AROM;Both Short Arc Quad: AROM;AAROM;Both;10 reps (AAROM for L LE) Heel Slides: AAROM;Both;10 reps Hip ABduction/ADduction: AAROM;Both;10  reps      Assessment/Plan    PT Assessment Patient needs continued PT services  PT Diagnosis Abnormality of gait;Acute pain   PT Problem List Decreased strength;Decreased range of motion;Decreased mobility;Decreased knowledge of use of DME;Pain  PT Treatment Interventions Functional mobility training;Gait training;DME instruction;Patient/family education;Therapeutic activities;Therapeutic exercise;Stair training   PT Goals (Current goals can be  found in the Care Plan section) Acute Rehab PT Goals PT Goal Formulation: With patient Time For Goal Achievement: 11/17/14 Potential to Achieve Goals: Good    Frequency 7X/week   Barriers to discharge        Co-evaluation               End of Session Equipment Utilized During Treatment: Gait belt Activity Tolerance: Patient tolerated treatment well Patient left: in chair;with call bell/phone within reach;with chair alarm set;with family/visitor present           Time: 1022-1053 PT Time Calculation (min) (ACUTE ONLY): 31 min   Charges:   PT Evaluation $Initial PT Evaluation Tier I: 1 Procedure PT Treatments $Therapeutic Exercise: 8-22 mins   PT G Codes:        Lawrence Hunt,Lawrence Hunt 11/13/2014, 12:11 PM Zenovia Jarred, PT, DPT 11/13/2014 Pager: [phone]

## 2014-11-13 NOTE — Evaluation (Signed)
Occupational Therapy Evaluation Patient Details Name: Lawrence GraceGilbert Hunt MRN: [id number] DOB: 09/06/1945 Today's Date: 11/13/2014    History of Present Illness Pt is a 69 year old male s/p bilateral direct anterior THAs   Clinical Impression   Pt up to 3in1 to practice toilet transfers. Pt's wife present for session. Educated on AE options for LB self care also. Will follow to progress ADL independence for d/c home with wife assisting PRN.    Follow Up Recommendations  No OT follow up;Supervision/Assistance - 24 hour    Equipment Recommendations  None recommended by OT    Recommendations for Other Services       Precautions / Restrictions Precautions Precautions: Fall Restrictions Weight Bearing Restrictions: No Other Position/Activity Restrictions: bilaterally WBAT      Mobility Bed Mobility      General bed mobility comments: pt in chair.   Transfers Overall transfer level: Needs assistance Equipment used: Rolling walker (2 wheeled) Transfers: Sit to/from Stand Sit to Stand: Mod assist         General transfer comment: mod assist to rise and steady. Cues for hand placement.     Balance                                            ADL Overall ADL's : Needs assistance/impaired Eating/Feeding: Independent;Sitting   Grooming: Wash/dry hands;Set up;Sitting   Upper Body Bathing: Set up;Sitting   Lower Body Bathing: Moderate assistance;Sit to/from stand   Upper Body Dressing : Set up;Sitting   Lower Body Dressing: Maximal assistance;Sit to/from stand   Toilet Transfer: Moderate assistance;Ambulation;BSC;RW   Toileting- Clothing Manipulation and Hygiene: Moderate assistance;Sit to/from stand         General ADL Comments: Discussed 3in1 to be used as a shower seat also. Also educated on AE for LB self care. Pt states he has something similar to our reacher and a sock aid. Educated on long shoe horn and sponge.  pt states he will consider  these options and wife can assist also. Pt with the most difficulty with sit to stand and stand to sit transitions but does well once up.      Vision     Perception     Praxis      Pertinent Vitals/Pain Pain Assessment: 0-10 Pain Score: 7  Pain Location: down to a 4 with rest. Pain Descriptors / Indicators: Aching Pain Intervention(s): Repositioned     Hand Dominance     Extremity/Trunk Assessment Upper Extremity Assessment Upper Extremity Assessment: Overall WFL for tasks assessed          Communication Communication Communication: No difficulties   Cognition Arousal/Alertness: Awake/alert Behavior During Therapy: WFL for tasks assessed/performed Overall Cognitive Status: Within Functional Limits for tasks assessed                     General Comments       Exercises       Shoulder Instructions      Home Living Family/patient expects to be discharged to:: Private residence Living Arrangements: Spouse/significant other Available Help at Discharge: Family Type of Home: House Home Access: Stairs to enter Secretary/administratorntrance Stairs-Number of Steps: 2   Home Layout: Able to live on main level with bedroom/bathroom     Bathroom Shower/Tub: Producer, television/film/videoWalk-in shower   Bathroom Toilet: Handicapped height     Home Equipment: Bedside commode;Adaptive equipment  Adaptive Equipment: Reacher;Sock aid        Prior Functioning/Environment Level of Independence: Independent             OT Diagnosis: Generalized weakness   OT Problem List: Decreased strength;Decreased knowledge of use of DME or AE   OT Treatment/Interventions: Self-care/ADL training;Patient/family education;Therapeutic activities;DME and/or AE instruction    OT Goals(Current goals can be found in the care plan section) Acute Rehab OT Goals Patient Stated Goal: agreeable to OT OT Goal Formulation: With patient Time For Goal Achievement: 11/20/14 Potential to Achieve Goals: Good  OT Frequency: Min  2X/week   Barriers to D/C:            Co-evaluation              End of Session Equipment Utilized During Treatment: Rolling walker  Activity Tolerance: Patient tolerated treatment well Patient left: in chair;with call bell/phone within reach;with chair alarm set   Time: 1137-1201 OT Time Calculation (min): 24 min Charges:  OT General Charges $OT Visit: 1 Procedure OT Evaluation $Initial OT Evaluation Tier I: 1 Procedure OT Treatments $Therapeutic Activity: 8-22 mins G-Codes:    Lennox Laity  [phone] 11/13/2014, 12:18 PM

## 2014-11-14 DIAGNOSIS — E663 Overweight: Secondary | ICD-10-CM | POA: Diagnosis present

## 2014-11-14 LAB — BASIC METABOLIC PANEL
ANION GAP: 4 — AB (ref 5–15)
BUN: 15 mg/dL (ref 6–20)
CHLORIDE: 100 mmol/L — AB (ref 101–111)
CO2: 32 mmol/L (ref 22–32)
Calcium: 8.3 mg/dL — ABNORMAL LOW (ref 8.9–10.3)
Creatinine, Ser: 0.93 mg/dL (ref 0.61–1.24)
GFR calc Af Amer: >60 mL/min (ref >60)
GLUCOSE: 135 mg/dL — AB (ref 65–99)
POTASSIUM: 3.8 mmol/L (ref 3.5–5.1)
SODIUM: 136 mmol/L (ref 135–145)

## 2014-11-14 LAB — CBC
HCT: 29.1 % — ABNORMAL LOW (ref 39.0–52.0)
HEMOGLOBIN: 10.2 g/dL — AB (ref 13.0–17.0)
MCH: 31.2 pg (ref 26.0–34.0)
MCHC: 35.1 g/dL (ref 30.0–36.0)
MCV: 89 fL (ref 78.0–100.0)
PLATELETS: 199 10*3/uL (ref 150–400)
RBC: 3.27 MIL/uL — AB (ref 4.22–5.81)
RDW: 14.1 % (ref 11.5–15.5)
WBC: 9.2 10*3/uL (ref 4.0–10.5)

## 2014-11-14 MED ORDER — ACETAMINOPHEN 500 MG PO TABS: 1000.0000 mg | ORAL_TABLET | Freq: Three times a day (TID) | ORAL | Status: AC

## 2014-11-14 MED ORDER — HYDROMORPHONE HCL 2 MG PO TABS: 2.0000 mg | ORAL_TABLET | ORAL | Status: AC | PRN

## 2014-11-14 MED ORDER — METHOCARBAMOL 500 MG PO TABS: 500.0000 mg | ORAL_TABLET | Freq: Four times a day (QID) | ORAL | Status: DC | PRN

## 2014-11-14 MED ORDER — DOCUSATE SODIUM 100 MG PO CAPS: 100.0000 mg | ORAL_CAPSULE | Freq: Two times a day (BID) | ORAL | Status: AC

## 2014-11-14 MED ORDER — POLYETHYLENE GLYCOL 3350 17 G PO PACK: 17.0000 g | PACK | Freq: Two times a day (BID) | ORAL | Status: AC

## 2014-11-14 MED ORDER — FERROUS SULFATE 325 (65 FE) MG PO TABS: 325.0000 mg | ORAL_TABLET | Freq: Three times a day (TID) | ORAL | Status: AC

## 2014-11-14 MED ORDER — APIXABAN 5 MG PO TABS: 5.0000 mg | ORAL_TABLET | Freq: Two times a day (BID) | ORAL | Status: AC

## 2014-11-14 MED ORDER — HYDROMORPHONE HCL 2 MG PO TABS
2.0000 mg | ORAL_TABLET | ORAL | Status: DC
  Administered 2014-11-14 (×2): 2 mg via ORAL
  Filled 2014-11-14 (×2): qty 1

## 2014-11-14 MED ORDER — TRAMADOL HCL 50 MG PO TABS: 50.0000 mg | ORAL_TABLET | Freq: Four times a day (QID) | ORAL | Status: DC | PRN

## 2014-11-14 MED ORDER — METHOCARBAMOL 500 MG PO TABS: 500.0000 mg | ORAL_TABLET | Freq: Four times a day (QID) | ORAL | Status: AC | PRN

## 2014-11-14 NOTE — Progress Notes (Signed)
Physical Therapy Treatment Patient Details Name: Wilma Wuthrich MRN: [id number] DOB: [birth year] Today's Date: 11/14/2014    History of Present Illness Pt is a 69 year old male s/p bilateral direct anterior THAs    PT Comments    Pt ambulated short distance however limited due to pain.  Pt assisted to recliner and RN notified.  Will need to practice steps prior to d/c.  Follow Up Recommendations  Home health PT;Supervision for mobility/OOB     Equipment Recommendations  Rolling walker with 5" wheels    Recommendations for Other Services       Precautions / Restrictions Precautions Precautions: Fall Restrictions Other Position/Activity Restrictions: bilaterally WBAT    Mobility  Bed Mobility Overal bed mobility: Needs Assistance Bed Mobility: Supine to Sit     Supine to sit: Min guard;HOB elevated     General bed mobility comments: increased time and effort as reminded pt not to use rail and trapeze since he won't have those available at home, verbal cues for self assist technique  Transfers Overall transfer level: Needs assistance Equipment used: Rolling walker (2 wheeled) Transfers: Sit to/from Stand Sit to Stand: Min assist         General transfer comment: assist to rise and steady, verbal cues for hand placement  Ambulation/Gait Ambulation/Gait assistance: Min guard Ambulation Distance (Feet): 15 Feet Assistive device: Rolling walker (2 wheeled) Gait Pattern/deviations: Step-to pattern;Antalgic;Narrow base of support     General Gait Details: short small steps, verbal cues for RW positioning, limited distance due to increased pain (pt reports changing pain meds last night due to rash), attempted first step however pt unable due to pain so assisted to recliner   Stairs            Wheelchair Mobility    Modified Rankin (Stroke Patients Only)       Balance                                    Cognition Arousal/Alertness:  Awake/alert Behavior During Therapy: WFL for tasks assessed/performed Overall Cognitive Status: Within Functional Limits for tasks assessed                      Exercises      General Comments        Pertinent Vitals/Pain Pain Assessment: 0-10 Pain Score: 10-Worst pain ever Pain Location: bil hips Pain Descriptors / Indicators: Aching;Sore Pain Intervention(s): Limited activity within patient's tolerance;Repositioned;Ice applied;Monitored during session;Patient requesting pain meds-RN notified    Home Living                      Prior Function            PT Goals (current goals can now be found in the care plan section) Progress towards PT goals: Not progressing toward goals - comment (limited by pain)    Frequency  7X/week    PT Plan Current plan remains appropriate    Co-evaluation             End of Session Equipment Utilized During Treatment: Gait belt Activity Tolerance: Patient limited by pain Patient left: with call bell/phone within reach;with family/visitor present;in chair     Time: 0911-0922 PT Time Calculation (min) (ACUTE ONLY): 11 min  Charges:  $Gait Training: 8-22 mins  G Codes:      Kallyn Demarcus,KATHrine E 11/14/2014, 9:45 AM Zenovia JarredKati Enrique Manganaro, PT, DPT 11/14/2014 Pager: [phone]

## 2014-11-14 NOTE — Progress Notes (Signed)
Physical Therapy Treatment Note    11/14/14 1400  PT Visit Information  Last PT Received On 11/14/14  Assistance Needed +1  History of Present Illness Pt is a 69 year old male s/p bilateral direct anterior THAs  PT Time Calculation  PT Start Time (ACUTE ONLY) 1336  PT Stop Time (ACUTE ONLY) 1353  PT Time Calculation (min) (ACUTE ONLY) 17 min  Subjective Data  Subjective Pt reports pain improved since this morning however still limiting mobility.  Pt tolerated short distance ambulation and practiced safe stair technique with spouse holding RW.  Pt would like to d/c home today so reviewed mobility, steps, safety, and answered questions in case pt feels able to d/c later today.  However, pt would benefit from another session tomorrow if remains in acute.  Precautions  Precautions Fall  Restrictions  Other Position/Activity Restrictions bilaterally WBAT  Pain Assessment  Pain Assessment 0-10  Pain Score 7  Pain Location bilateral hips  Pain Descriptors / Indicators Sore;Aching  Pain Intervention(s) Limited activity within patient's tolerance;Monitored during session;Premedicated before session  Cognition  Arousal/Alertness Awake/alert  Behavior During Therapy WFL for tasks assessed/performed  Overall Cognitive Status Within Functional Limits for tasks assessed  Bed Mobility  General bed mobility comments pt in recliner on arrival  Transfers  Overall transfer level Needs assistance  Equipment used Rolling walker (2 wheeled)  Transfers Sit to/from Stand  Sit to Stand Min guard  General transfer comment increased time and effort however able to perform min/guard for safety, discussed using armrests at home or if none having spouse hold down RW for pt to use  Ambulation/Gait  Ambulation/Gait assistance Min guard  Ambulation Distance (Feet) 35 Feet  Assistive device Rolling walker (2 wheeled)  Gait Pattern/deviations Step-to pattern;Antalgic  General Gait Details short small steps, pt  able to tolerate a little increase in distance from this morning  Stairs Yes  Stairs assistance Min assist  Stair Management Backwards;With walker;Step to pattern  Number of Stairs 2  General stair comments verbal cues for safety, sequence, RW positioning, pt with difficulty lifting LEs, spouse educated and held RW  PT - End of Session  Equipment Utilized During Treatment Gait belt  Activity Tolerance Patient limited by pain  Patient left in chair;with call bell/phone within reach;with family/visitor present  Nurse Communication Mobility status  PT - Assessment/Plan  PT Plan Current plan remains appropriate  PT Frequency (ACUTE ONLY) 7X/week  Follow Up Recommendations Home health PT;Supervision for mobility/OOB  PT equipment Rolling walker with 5" wheels  PT Goal Progression  Progress towards PT goals Progressing toward goals  PT General Charges  $$ ACUTE PT VISIT 1 Procedure  PT Treatments  $Gait Training 8-22 mins   Zenovia JarredKati Deondrea Markos, PT, DPT 11/14/2014 Pager: [phone]

## 2014-11-14 NOTE — Progress Notes (Signed)
     Subjective: 2 Days Post-Op Procedure(s) (LRB): BILATERAL ANTERIOR TOTAL HIP ARTHROPLASTY (Bilateral)   Patient reports pain as mild, pain controlled. No events throughout the night. We have discussed the recovery period and activities to assist in recovery.  Ready to be discharged home.  Objective:   VITALS:   Filed Vitals:   11/14/14  BP: 106/57  Pulse: 71  Temp: 98.6 F (37 C)   Resp: 16    Dorsiflexion/Plantar flexion intact Incision: dressing C/D/I No cellulitis present Compartment soft  LABS  Recent Labs  11/13/14 0507 11/14/14 0422  HGB 11.6* 10.2*  HCT 34.1* 29.1*  WBC 10.6* 9.2  PLT 239 199     Recent Labs  11/13/14 0507 11/14/14 0422  NA 138 136  K 4.5 3.8  BUN 13 15  CREATININE 0.82 0.93  GLUCOSE 175* 135*     Assessment/Plan: 2 Days Post-Op Procedure(s) (LRB): BILATERAL ANTERIOR TOTAL HIP ARTHROPLASTY (Bilateral) Up with therapy Discharge home with home health  [facility]. Follow up with OLIN,Willy Vorce D in 2 weeks.  Contact information:  [facility] [address] [place] [id number] [phone]     Overweight (BMI 25-29.9) Estimated body mass index is 28.95 kg/(m^2) as calculated from the following:   Height as of this encounter: 5\' 8"  (1.727 m).   Weight as of this encounter: 86.354 kg (190 lb 6 oz). Patient also counseled that weight may inhibit the healing process Patient counseled that losing weight will help with future health issues         Anastasio AuerbachMatthew S. Chiniqua Kilcrease   PAC  11/14/2014, 9:09 AM

## 2014-11-14 NOTE — Progress Notes (Signed)
Occupational Therapy Treatment Patient Details Name: Lawrence Hunt MRN: [id number] DOB: 08-Nov-1945 Today's Date: 11/14/2014    History of present illness Pt is a 69 year old male s/p bilateral direct anterior THAs   OT comments  Pt limited by pain today--though he states the pain is better than earlier this morning, he still states pain is significant--rates 7/10 during session. Wife present for education. Recommend HHOT to reinforce education and practice shower transfer with pt when he is more ready to tolerate a shower/step over ledge. Nursing made aware of pain level.     Follow Up Recommendations  Home health OT;Supervision/Assistance - 24 hour    Equipment Recommendations  None recommended by OT    Recommendations for Other Services      Precautions / Restrictions Precautions Precautions: Fall Restrictions Weight Bearing Restrictions: No Other Position/Activity Restrictions: bilaterally WBAT       Mobility Bed Mobility Overal bed mobility: Needs Assistance           General bed mobility comments: in chair.   Transfers Overall transfer level: Needs assistance Equipment used: Rolling walker (2 wheeled) Transfers: Sit to/from Stand Sit to Stand: Min assist         General transfer comment: increased time. Assist to rise and steady. cues for hand placement.    Balance                                   ADL                           Toilet Transfer: Minimal assistance;Ambulation;BSC;RW   Toileting- Clothing Manipulation and Hygiene: Minimal assistance;Sit to/from stand         General ADL Comments: Pt states he does have most of AE but is considering obtaining a reacher at d/c. Practiced using reacher to don shorts today and needed min assist to start over LEs. Wife present and also able to assist with self care at home. She did help with ADL today as well. Educated on safety with functional transfers and proper hand placement  to stand and sit back down. Educated wife on standing beside of pt during clothing management, standing hygiene task and have  arms around him for slight support. Pt stating pain persisted at 7/10 during session and pt concerned about pain level. Informed nursing of pain level during session. Pt struggling to complete sponge bath and dress from 3in1 due to pain so advised pt to sponge bathe at home initially and let HHOT assess shower transfers once pain more under control. Pt and wife agreeable.       Vision                     Perception     Praxis      Cognition   Behavior During Therapy: WFL for tasks assessed/performed Overall Cognitive Status: Within Functional Limits for tasks assessed                       Extremity/Trunk Assessment               Exercises     Shoulder Instructions       General Comments      Pertinent Vitals/ Pain       Pain Assessment: 0-10 Pain Score: 7  Pain Location: bilaterla hip Pain Descriptors / Indicators:  Aching;Sore Pain Intervention(s): Repositioned;Ice applied;Monitored during session  Home Living                                          Prior Functioning/Environment              Frequency Min 2X/week     Progress Toward Goals  OT Goals(current goals can now be found in the care plan section)  Progress towards OT goals: Progressing toward goals     Plan Discharge plan needs to be updated    Co-evaluation                 End of Session Equipment Utilized During Treatment: Rolling walker   Activity Tolerance Patient limited by pain   Patient Left in chair;with call bell/phone within reach;with family/visitor present   Nurse Communication          Time: 1007-1050 OT Time Calculation (min): 43 min  Charges: OT General Charges $OT Visit: 1 Procedure OT Treatments $Self Care/Home Management : 23-37 mins $Therapeutic Activity: 8-22 mins  Lennox LaityStone, Bowie Delia  [place]  [phone] 11/14/2014, 12:27 PM

## 2014-11-19 NOTE — Discharge Summary (Signed)
Physician Discharge Summary  Patient ID: Lawrence Hunt MRN: [id number] DOB/AGE: Aug 27, 1945 69 y.o.  Admit date: 11/12/2014 Discharge date: 11/14/2014   Procedures:  Procedure(s) (LRB): BILATERAL ANTERIOR TOTAL HIP ARTHROPLASTY (Bilateral)  Attending Physician:  Dr. Durene Romans   Admission Diagnoses:   Bilateral hip primary OA / pain  Discharge Diagnoses:  Principal Problem:   S/P bilateral THAs Active Problems:   Overweight (BMI 25.0-29.9)  Past Medical History  Diagnosis Date  . Hypertension   . Dysrhythmia     atrial fib   . Sleep apnea     cpap- does not know settings   . Kidney stones     hx of   . Prostatitis   . GERD (gastroesophageal reflux disease)   . Arthritis     HPI:    Lawrence Hunt, 69 y.o. male, has a history of pain and functional disability in bilateral hips due to arthritis and patient has failed non-surgical conservative treatments for greater than 12 weeks to include NSAID's and/or analgesics and activity modification. Onset of symptoms was gradual starting years ago with gradually worsening course since that time.The patient noted no past surgery on the bilateral hips. Patient currently rates pain in the bilateral hips at 9 out of 10 with activity. Patient has worsening of pain with activity and weight bearing, trendelenberg gait, pain that interfers with activities of daily living and pain with passive range of motion. Patient has evidence of periarticular osteophytes and joint space narrowing by imaging studies. This condition presents safety issues increasing the risk of falls. There is no current active infection. Risks, benefits and expectations were discussed with the patient. Risks including but not limited to the risk of anesthesia, blood clots, nerve damage, blood vessel damage, failure of the prosthesis, infection and up to and including death. Patient understand the risks, benefits and expectations and wishes to proceed with surgery.    PCP: Pcp Not In System   Discharged Condition: good  Hospital Course:  Patient underwent the above stated procedure on 11/12/2014. Patient tolerated the procedure well and brought to the recovery room in good condition and subsequently to the floor.  POD #1 BP: 106/57 ; Pulse: 60 ; Temp: 98.1 F (36.7 C) ; Resp: 16 Patient reports pain as moderate. No events over night. Ready for therapy. Neurovascular intact and incision: dressing C/D/I, bilateral.  LABS  Basename    HGB  11.6  HCT  34.1   POD #2  BP: 106/57 ; Pulse: 71 ; Temp: 98.6 F (37 C) ; Resp: 16 Patient reports pain as mild, pain controlled. No events throughout the night. We have discussed the recovery period and activities to assist in recovery.Changed analgesic medication to Dilaudid from tramadol, tramadol wasn't fully covering the pain and the codeine medications were causing severe itching.  Ready to be discharged home. Dorsiflexion/plantar flexion intact, incision: dressing C/D/I, no cellulitis present and compartment soft.   LABS  Basename    HGB  10.2  HCT  29.1    Discharge Exam: General appearance: alert, cooperative and no distress Extremities: Homans sign is negative, no sign of DVT, no edema, redness or tenderness in the calves or thighs and no ulcers, gangrene or trophic changes  Disposition: Home with follow up in 2 weeks   Follow-up Information    Follow up with Shelda Pal, MD. Schedule an appointment as soon as possible for a visit in 2 weeks.   Specialty:  Orthopedic Surgery   Contact information:   [address]  [address] [place] [address] [phone]       Follow up with [facility] .   Why:  [facility] will call you with the name and arrangements of your home health agency; if you need to call [facility] please give then your INTAKE # [phone].   Contact information:   [phone] INTAKE# [id number]      Follow up with [facility].   Why:  [facility] is the agency [facility]  has secured for you.  [facility] will call you and arrange your home health physical therapy   Contact information:   [phone]      Discharge Instructions    Call MD / Call 911    Complete by:  As directed   If you experience chest pain or shortness of breath, CALL 911 and be transported to the hospital emergency room.  If you develope a fever above 101 F, pus (white drainage) or increased drainage or redness at the wound, or calf pain, call your surgeon's office.     Change dressing    Complete by:  As directed   Maintain surgical dressing until follow up in the clinic. If the edges start to pull up, may reinforce with tape. If the dressing is no longer working, may remove and cover with gauze and tape, but must keep the area dry and clean.  Call with any questions or concerns.     Constipation Prevention    Complete by:  As directed   Drink plenty of fluids.  Prune juice may be helpful.  You may use a stool softener, such as Colace (over the counter) 100 mg twice a day.  Use MiraLax (over the counter) for constipation as needed.     Diet - low sodium heart healthy    Complete by:  As directed      Discharge instructions    Complete by:  As directed   Maintain surgical dressing until follow up in the clinic. If the edges start to pull up, may reinforce with tape. If the dressing is no longer working, may remove and cover with gauze and tape, but must keep the area dry and clean.  Follow up in 2 weeks at [facility]. Call with any questions or concerns.     Increase activity slowly as tolerated    Complete by:  As directed   Weight bearing as tolerated with assist device (walker, cane, etc) as directed, use it as long as suggested by your surgeon or therapist, typically at least 4-6 weeks.     TED hose    Complete by:  As directed   Use stockings (TED hose) for 2 weeks on both leg(s).  You may remove them at night for sleeping.             Medication List    STOP taking  these medications        aspirin EC 81 MG tablet     diclofenac sodium 1 % Gel  Commonly known as:  VOLTAREN      TAKE these medications        acetaminophen 500 MG tablet  Commonly known as:  TYLENOL  Take 2 tablets (1,000 mg total) by mouth every 8 (eight) hours.     amLODipine-benazepril 5-40 MG capsule  Commonly known as:  LOTREL  Take 1 capsule by mouth daily. Patient takes in the pm     apixaban 5 MG Tabs tablet  Commonly known as:  ELIQUIS  Take  1 tablet (5 mg total) by mouth 2 (two) times daily.     docusate sodium 100 MG capsule  Commonly known as:  COLACE  Take 1 capsule (100 mg total) by mouth 2 (two) times daily.     dronedarone 400 MG tablet  Commonly known as:  MULTAQ  Take 400 mg by mouth 2 (two) times daily with a meal.     ferrous sulfate 325 (65 FE) MG tablet  Take 1 tablet (325 mg total) by mouth 3 (three) times daily after meals.     hydrochlorothiazide 12.5 MG capsule  Commonly known as:  MICROZIDE  Take 12.5 mg by mouth daily.     HYDROmorphone 2 MG tablet  Commonly known as:  DILAUDID  Take 1-2 tablets (2-4 mg total) by mouth every 4 (four) hours as needed for severe pain.     methocarbamol 500 MG tablet  Commonly known as:  ROBAXIN  Take 1 tablet (500 mg total) by mouth every 6 (six) hours as needed for muscle spasms.     multivitamin with minerals Tabs tablet  Take 1 tablet by mouth daily.     polyethylene glycol packet  Commonly known as:  MIRALAX / GLYCOLAX  Take 17 g by mouth 2 (two) times daily.     ranitidine 150 MG capsule  Commonly known as:  ZANTAC  Take 150 mg by mouth as needed for heartburn.     Vitamin D 2000 UNITS tablet  Take 2,000 Units by mouth daily.         Signed: Anastasio AuerbachMatthew S. Yael Coppess   PA-C  11/19/2014, 7:30 AM

## 2017-07-13 IMAGING — DX DG HIP (WITH OR WITHOUT PELVIS) 1V PORT*R*
1 series · 1 of 1 positions shown · non-contrast
Comparison: None.

CLINICAL DATA: Subsequent encounter for bilateral hip replacement

EXAM:
DG HIP (WITH OR WITHOUT PELVIS) 1V PORT LEFT; DG HIP (WITH OR
WITHOUT PELVIS) 1V PORT RIGHT

[hip x-table]
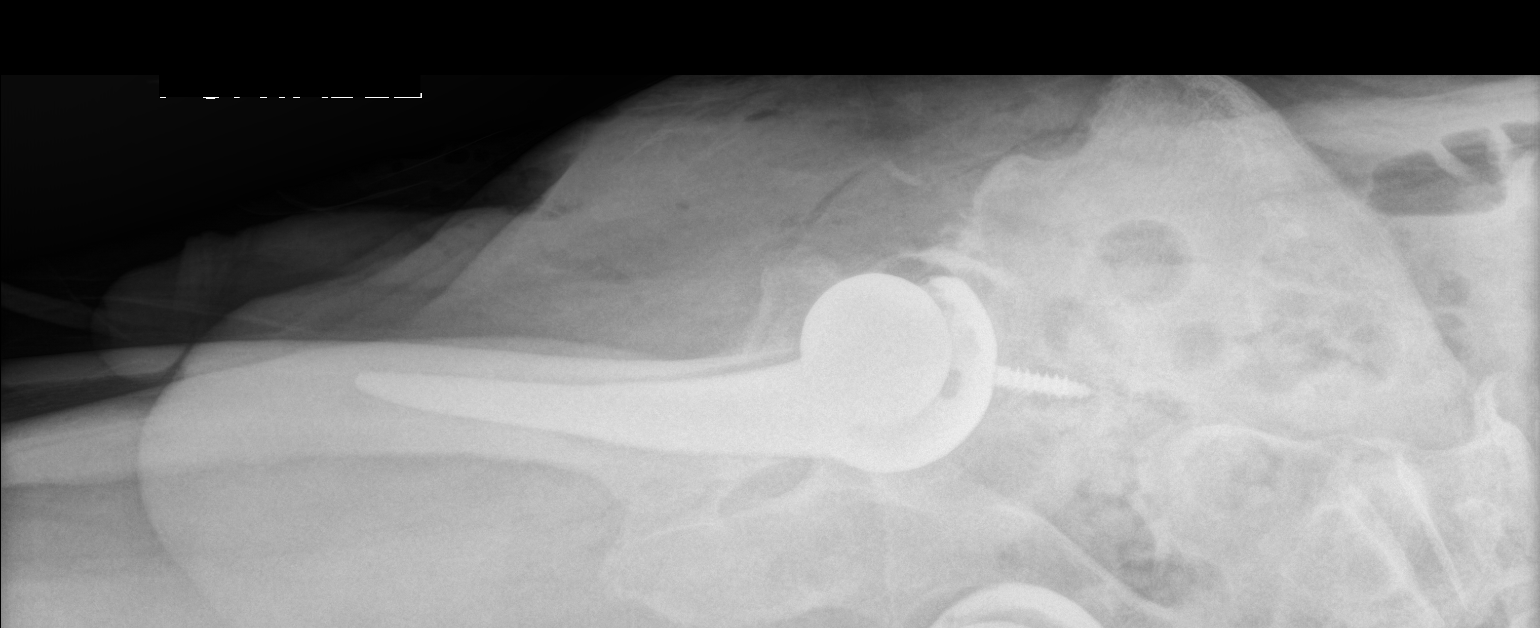

[1 of 1 positions shown; findings below may reference images not displayed]

FINDINGS: The AP pelvis shows the patient be status post bilateral total hip
replacement. Cross-table lateral view of the left hip shows a
femoral component located in the acetabular cup. Cross-table lateral
view right hip similarly confirms that the femoral component is
located. No evidence for immediate hardware complications.
IMPRESSION: Status post bilateral hip replacement without evidence for immediate
postoperative complication.
